# Patient Record
Sex: Male | Born: 1970 | Race: White | Hispanic: No | Marital: Married | Smoking: Current every day smoker
Health system: Southern US, Community
[De-identification: ages and names within clinical notes are randomized; demographics above are authoritative.]

## PROBLEM LIST (undated history)

## (undated) DIAGNOSIS — J189 Pneumonia, unspecified organism: Secondary | ICD-10-CM

## (undated) HISTORY — PX: BACK SURGERY: SHX140

---

## 2015-03-06 ENCOUNTER — Ambulatory Visit: Payer: BLUE CROSS/BLUE SHIELD

## 2015-03-06 ENCOUNTER — Encounter: Payer: Self-pay | Admitting: *Deleted

## 2015-03-06 ENCOUNTER — Ambulatory Visit
Admission: EM | Admit: 2015-03-06 | Discharge: 2015-03-06 | Disposition: A | Discharge status: Home / Self Care | Payer: BLUE CROSS/BLUE SHIELD | Attending: Family Medicine | Admitting: Family Medicine

## 2015-03-06 DIAGNOSIS — R509 Fever, unspecified: Secondary | ICD-10-CM | POA: Diagnosis not present

## 2015-03-06 DIAGNOSIS — J189 Pneumonia, unspecified organism: Secondary | ICD-10-CM | POA: Diagnosis not present

## 2015-03-06 DIAGNOSIS — D72829 Elevated white blood cell count, unspecified: Secondary | ICD-10-CM | POA: Diagnosis not present

## 2015-03-06 LAB — URINALYSIS COMPLETE WITH MICROSCOPIC (ARMC ONLY)
Bacteria, UA: NONE SEEN — AB
Squamous Epithelial / LPF: NONE SEEN — AB

## 2015-03-06 LAB — CBC WITH DIFFERENTIAL/PLATELET
Basophils Absolute: 0.1 10*3/uL (ref 0–0.1)
Basophils Relative: 1 %
EOS PCT: 0 %
Eosinophils Absolute: 0.1 10*3/uL (ref 0–0.7)
HEMATOCRIT: 45.1 % (ref 40.0–52.0)
Hemoglobin: 15.4 g/dL (ref 13.0–18.0)
LYMPHS ABS: 2.3 10*3/uL (ref 1.0–3.6)
Lymphocytes Relative: 13 %
MCH: 31.9 pg (ref 26.0–34.0)
MCHC: 34.3 g/dL (ref 32.0–36.0)
MCV: 93.2 fL (ref 80.0–100.0)
MONO ABS: 1.3 10*3/uL — AB (ref 0.2–1.0)
MONOS PCT: 8 %
Neutro Abs: 13.4 10*3/uL — ABNORMAL HIGH (ref 1.4–6.5)
Neutrophils Relative %: 78 %
Platelets: 259 10*3/uL (ref 150–440)
RBC: 4.83 MIL/uL (ref 4.40–5.90)
RDW: 13 % (ref 11.5–14.5)
WBC: 17.3 10*3/uL — ABNORMAL HIGH (ref 3.8–10.6)

## 2015-03-06 LAB — COMPREHENSIVE METABOLIC PANEL
ALK PHOS: 103 U/L (ref 38–126)
ALT: 11 U/L — ABNORMAL LOW (ref 17–63)
ANION GAP: 12 (ref 5–15)
AST: 17 U/L (ref 15–41)
Albumin: 3.7 g/dL (ref 3.5–5.0)
BUN: 21 mg/dL — ABNORMAL HIGH (ref 6–20)
CO2: 25 mmol/L (ref 22–32)
Calcium: 9.1 mg/dL (ref 8.9–10.3)
Chloride: 100 mmol/L — ABNORMAL LOW (ref 101–111)
Creatinine, Ser: 0.71 mg/dL (ref 0.61–1.24)
GFR calc non Af Amer: >60 mL/min (ref >60)
Glucose, Bld: 115 mg/dL — ABNORMAL HIGH (ref 65–99)
POTASSIUM: 3.7 mmol/L (ref 3.5–5.1)
SODIUM: 137 mmol/L (ref 135–145)
Total Bilirubin: 1.4 mg/dL — ABNORMAL HIGH (ref 0.3–1.2)
Total Protein: 8.3 g/dL — ABNORMAL HIGH (ref 6.5–8.1)

## 2015-03-06 LAB — RAPID INFLUENZA A&B ANTIGENS (ARMC ONLY)
INFLUENZA A (ARMC): NOT DETECTED
INFLUENZA B (ARMC): NOT DETECTED

## 2015-03-06 MED ORDER — LEVOFLOXACIN 750 MG PO TABS: 750.0000 mg | ORAL_TABLET | Freq: Every day | ORAL | Status: AC

## 2015-03-06 NOTE — ED Provider Notes (Signed)
CSN: [id number]     Arrival date & time 03/06/15  1320 History   First MD Initiated Contact with Patient 03/06/15 1511     Chief Complaint  Patient presents with  . 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 Predates finding a single non-engorged "lone star tick " by his report in right axilla 4 days ago- tender attachment site but no abcess, no rash. Reports Joints aching particularly knees and figers. Smoking about 1ppd x 30 plus years ( since age 57).Usual morning cough,recently had night cough as well. OTC not helping.Does not have PCP - no routine visits- no physicals for many years. Read about tick bites and presents for evaluation thinking he has "tick fever". Long bone aching -taking 3-4 BCs daily or more--now experiencing tinnitus. Current active smoker of 1 ppd plus...until about a week ago when he lost his appetite for cigarettes As he developed malaise.  History reviewed. No pertinent past medical history. Past Surgical History  Procedure Laterality Date  . Back surgery     Family History  Problem Relation Age of Onset  . Heart failure Mother   . Heart failure Father   . Cancer Father    History  Substance Use Topics  . Smoking status: Former Smoker -- 1.00 packs/day    Types: Cigarettes    Quit date: 03/06/2015  . Smokeless tobacco: Never Used  . Alcohol Use: Yes    Review of Systems  Constitutional -intermittent fevers to 101 reported, malaise, anorexia Eyes-denies visual changes ENT- normal voice,denies sore throat CV-denies chest pain, has cough,non-productive Resp-denies SOB GI- negative for nausea,vomiting, diarrhea, no belly pain, +anorexia  1 week GU- negative for dysuria MSK- had had some back pain- long days -farmer heavy  work- Right sided chest pain from coughing  joints have been aching knees and hands- denies swelling Skin- denies acute changes- no rashes Neuro- negative headache,focal weakness or numbness   Allergies  Penicillins  Home Medications   Prior to Admission medications   Medication Sig Start Date End Date Taking? Authorizing Provider  levofloxacin (LEVAQUIN) 750 MG tablet Take 1 tablet (750 mg total) by mouth daily. 03/06/15 03/10/15  Rae Halsted, PA-C   BP 146/89 mmHg  Pulse 89  Temp(Src) 101 F (38.3 C) (Oral)  Resp 20  Ht 5\' 10"  (1.778 m)  Wt 180 lb (81.647 kg)  BMI 25.83 kg/m2  SpO2 100% Physical Exam  Slender M appearing much older than stated age  Temp 101 darkly tanned, weathered skin Constitutional -alert and oriented, in mild distress about feeling bad lately Head-atraumatic Eyes- conjunctiva normal, EOMI ,conjugate gaze Ears-neg exam,tinnitus reported Nose- no congestion or rhinorrhea Mouth/throat- mucous membranes moist ,oropharynx mild erythematous Neck- supple without glandular enlargement CV- regular rate, grossly normal heart sounds, good peripheral circulation Resp-no distress, normal respiratory effort,clear to auscultation bilaterally GI- soft,non-tender,no distention,normal BS GU- not examined Back -well healed midline scar thoraco/lumbar from spinal fusion hx MSK- no lower extremity tenderness nor edema,no joint effusion, ambulatory Neuro- normal speech and language, no gross focal neurological deficit appreciated, no gait instability Skin-warm,dry ,intact; no rash noted Psych-mood and affect grossly normal; speech and behavior grossly normal  ED Course  Procedures (including critical care time) Labs Review Labs Reviewed  CBC WITH DIFFERENTIAL/PLATELET - Abnormal; Notable for the following:    WBC 17.3 (*)  Neutro Abs 13.4 (*)    Monocytes Absolute 1.3 (*)    All other components within normal limits  URINALYSIS COMPLETEWITH MICROSCOPIC ([facility]  ONLY) - Abnormal; Notable for the following:    Color, Urine ORANGE (*)    APPearance HAZY (*)    Glucose, UA   (*)    Value: TEST NOT REPORTED DUE TO COLOR INTERFERENCE OF URINE PIGMENT   Bilirubin Urine   (*)    Value: TEST NOT REPORTED DUE TO COLOR INTERFERENCE OF URINE PIGMENT   Ketones, ur   (*)    Value: TEST NOT REPORTED DUE TO COLOR INTERFERENCE OF URINE PIGMENT   Hgb urine dipstick   (*)    Value: TEST NOT REPORTED DUE TO COLOR INTERFERENCE OF URINE PIGMENT   Protein, ur   (*)    Value: TEST NOT REPORTED DUE TO COLOR INTERFERENCE OF URINE PIGMENT   Nitrite   (*)    Value: TEST NOT REPORTED DUE TO COLOR INTERFERENCE OF URINE PIGMENT   Leukocytes, UA   (*)    Value: TEST NOT REPORTED DUE TO COLOR INTERFERENCE OF URINE PIGMENT   Bacteria, UA NONE SEEN (*)    Squamous Epithelial / LPF NONE SEEN (*)    All other components within normal limits  COMPREHENSIVE METABOLIC PANEL - Abnormal; Notable for the following:    Chloride 100 (*)    Glucose, Bld 115 (*)    BUN 21 (*)    Total Protein 8.3 (*)    ALT 11 (*)    Total Bilirubin 1.4 (*)    All other components within normal limits  INFLUENZA A&B ANTIGENS ([facility] ONLY)  ROCKY MTN SPOTTED FVR ABS PNL(IGG+IGM)    Imaging Review No results found.  . Results for orders placed or performed during the hospital encounter of 03/06/15  Influenza A&B Antigens [facility])  Result Value Ref Range   Influenza A [facility]) NOT DETECTED    Influenza B ([facility]) NOT DETECTED   CBC with Differential  Result Value Ref Range   WBC 17.3 (H) 3.8 - 10.6 K/uL   RBC 4.83 4.40 - 5.90 MIL/uL   Hemoglobin 15.4 13.0 - 18.0 g/dL   HCT 16.145.1 09.640.0 - 04.552.0 %   MCV 93.2 80.0 - 100.0 fL   MCH 31.9 26.0 - 34.0 pg   MCHC 34.3 32.0 - 36.0 g/dL   RDW 40.913.0 81.111.5 - 91.414.5 %   Platelets 259 150 - 440 K/uL   Neutrophils Relative % 78 %   Neutro Abs 13.4 (H) 1.4 - 6.5 K/uL   Lymphocytes Relative 13 %   Lymphs Abs 2.3 1.0 - 3.6 K/uL   Monocytes Relative 8 %   Monocytes  Absolute 1.3 (H) 0.2 - 1.0 K/uL   Eosinophils Relative 0 %   Eosinophils Absolute 0.1 0 - 0.7 K/uL   Basophils Relative 1 %   Basophils Absolute 0.1 0 - 0.1 K/uL  Urinalysis complete, with microscopic  Result Value Ref Range   Color, Urine ORANGE (A) YELLOW   APPearance HAZY (A) CLEAR   Glucose, UA (A) NEGATIVE mg/dL    TEST NOT REPORTED DUE TO COLOR INTERFERENCE OF URINE PIGMENT   Bilirubin Urine (A) NEGATIVE    TEST NOT REPORTED DUE TO COLOR INTERFERENCE OF URINE PIGMENT   Ketones, ur (A) NEGATIVE mg/dL    TEST NOT REPORTED DUE TO COLOR INTERFERENCE OF URINE PIGMENT   Specific Gravity, Urine  1.005 - 1.030    TEST NOT REPORTED DUE TO COLOR  INTERFERENCE OF URINE PIGMENT   Hgb urine dipstick (A) NEGATIVE    TEST NOT REPORTED DUE TO COLOR INTERFERENCE OF URINE PIGMENT   pH  5.0 - 8.0    TEST NOT REPORTED DUE TO COLOR INTERFERENCE OF URINE PIGMENT   Protein, ur (A) NEGATIVE mg/dL    TEST NOT REPORTED DUE TO COLOR INTERFERENCE OF URINE PIGMENT   Nitrite (A) NEGATIVE    TEST NOT REPORTED DUE TO COLOR INTERFERENCE OF URINE PIGMENT   Leukocytes, UA (A) NEGATIVE    TEST NOT REPORTED DUE TO COLOR INTERFERENCE OF URINE PIGMENT   RBC / HPF 6-30 <3 RBC/hpf   WBC, UA 6-30 <3 WBC/hpf   Bacteria, UA NONE SEEN (A) RARE   Squamous Epithelial / LPF NONE SEEN (A) RARE   Mucous PRESENT    Hyaline Casts, UA PRESENT   Comprehensive metabolic panel  Result Value Ref Range   Sodium 137 135 - 145 mmol/L   Potassium 3.7 3.5 - 5.1 mmol/L   Chloride 100 (L) 101 - 111 mmol/L   CO2 25 22 - 32 mmol/L   Glucose, Bld 115 (H) 65 - 99 mg/dL   BUN 21 (H) 6 - 20 mg/dL   Creatinine, Ser 1.61 0.61 - 1.24 mg/dL   Calcium 9.1 8.9 - 09.6 mg/dL   Total Protein 8.3 (H) 6.5 - 8.1 g/dL   Albumin 3.7 3.5 - 5.0 g/dL   AST 17 15 - 41 U/L   ALT 11 (L) 17 - 63 U/L   Alkaline Phosphatase 103 38 - 126 U/L   Total Bilirubin 1.4 (H) 0.3 - 1.2 mg/dL   GFR calc non Af Amer >60 >60 mL/min   GFR calc Af Amer >60 >60 mL/min    Anion gap 12 5 - 15    Reports decreasing appetite since this episode started a week ago,.Had noodle soup last night and a pack of nabs this morning but denies other intake. (glucose 115) Not sure but thinks his mother may have had DM. Strongly encouraged to work to hydrate himself- urine culture pending. He noted onset of darker urine yesterday. Need alternating tylenol and ibuprofen for fever control ( and hydration)- avoid aspirin /BCs. The area in the lung is being treated as pneumonia but needs to be followed closely after therapy for resolution as rounded appearance not classic for wedge of pneumonia.His additional labs are pending.  MDM   1. Community acquired pneumonia    Discharge Medication List as of 03/06/2015  5:36 PM    START taking these medications   Details  levofloxacin (LEVAQUIN) 750 MG tablet Take 1 tablet (750 mg total) by mouth daily., Starting 03/06/2015, Until Thu 03/10/15, Normal      Benzonatate 200 mg # 30   1 TID prn cough  called to pharmacy drive thru   He and his wife are going to [facility] on Tuesday morning for her appointment  And will to try to establish care for him. If he can be seen in near future will establish, records will be forwarded to them. If he is unable to be seen he is to report back here for re-evaluation in the next week. In ability to take fluids or to eat ar to be taken very seriously and he is to return.  Fever that cannot be controlled with tylenol/ibuprofen /tepid baths and hydration need to be responded to as well.   Plan: 1. Test/x-ray results and diagnosis reviewed with patient. Additional results pending. 2. rx  as per orders; risks, benefits, potential side effects reviewed with patient 3. Recommend supportive treatment with fever control,hydration, rest 4. F/u prn if symptoms worsen or don't improve Diagnosis and treatment discussed. . Questions fielded, expectations and recommendations reviewed. Patient /wife express  understanding. Will return to [facility] or ER of choice with questions, concern or exacerbation.     Rae Halsted, PA-C 03/08/15 1714

## 2015-03-06 NOTE — ED Notes (Addendum)
Tick bite found Tuesday, removed it himself.  Achy and fever started Sunday before he actually found the tick.  Complains of malaise, body aches, chills and headache.  He states tick was a "lone star tick"  Removed tick from right axilla.  Area slightly red, no red ring or drainage noted.

## 2015-03-10 ENCOUNTER — Encounter: Payer: Self-pay | Admitting: Emergency Medicine

## 2015-03-10 ENCOUNTER — Ambulatory Visit
Admission: EM | Admit: 2015-03-10 | Discharge: 2015-03-10 | Disposition: A | Discharge status: Home / Self Care | Payer: BLUE CROSS/BLUE SHIELD | Attending: Internal Medicine | Admitting: Internal Medicine

## 2015-03-10 DIAGNOSIS — F17210 Nicotine dependence, cigarettes, uncomplicated: Secondary | ICD-10-CM | POA: Insufficient documentation

## 2015-03-10 DIAGNOSIS — Z79899 Other long term (current) drug therapy: Secondary | ICD-10-CM | POA: Diagnosis not present

## 2015-03-10 DIAGNOSIS — J189 Pneumonia, unspecified organism: Secondary | ICD-10-CM

## 2015-03-10 DIAGNOSIS — R05 Cough: Secondary | ICD-10-CM | POA: Diagnosis present

## 2015-03-10 DIAGNOSIS — R509 Fever, unspecified: Secondary | ICD-10-CM | POA: Diagnosis present

## 2015-03-10 HISTORY — DX: Pneumonia, unspecified organism: J18.9

## 2015-03-10 LAB — CBC WITH DIFFERENTIAL/PLATELET
Basophils Absolute: 0.1 10*3/uL (ref 0–0.1)
Basophils Relative: 1 %
Eosinophils Absolute: 0.1 10*3/uL (ref 0–0.7)
Eosinophils Relative: 0 %
HCT: 42 % (ref 40.0–52.0)
Hemoglobin: 14.5 g/dL (ref 13.0–18.0)
Lymphocytes Relative: 10 %
Lymphs Abs: 1.7 10*3/uL (ref 1.0–3.6)
MCH: 31.8 pg (ref 26.0–34.0)
MCHC: 34.6 g/dL (ref 32.0–36.0)
MCV: 92 fL (ref 80.0–100.0)
Monocytes Absolute: 1.5 10*3/uL — ABNORMAL HIGH (ref 0.2–1.0)
Monocytes Relative: 9 %
Neutro Abs: 13.8 10*3/uL — ABNORMAL HIGH (ref 1.4–6.5)
Neutrophils Relative %: 80 %
Platelets: 337 10*3/uL (ref 150–440)
RBC: 4.56 MIL/uL (ref 4.40–5.90)
RDW: 12.8 % (ref 11.5–14.5)
WBC: 17.1 10*3/uL — ABNORMAL HIGH (ref 3.8–10.6)

## 2015-03-10 LAB — ROCKY MTN SPOTTED FVR ABS PNL(IGG+IGM)
RMSF IGM: 0.35 {index} (ref 0.00–0.89)
RMSF IgG: POSITIVE — AB

## 2015-03-10 LAB — RMSF, IGG, IFA: RMSF, IGG, IFA: 1:256 {titer} — ABNORMAL HIGH

## 2015-03-10 MED ORDER — LEVOFLOXACIN 500 MG PO TABS: 500.0000 mg | ORAL_TABLET | Freq: Every day | ORAL | Status: AC

## 2015-03-10 NOTE — Discharge Instructions (Signed)

## 2015-03-10 NOTE — ED Notes (Signed)
Pt seen here Sunday, diagnosed with pneumonia, taking Levaquin. Pt reports still having fever and chills, coughing a lot, doesn't feel much better.

## 2015-03-10 NOTE — ED Provider Notes (Signed)
CSN: [id number]     Arrival date & time 03/10/15  1442 History   First MD Initiated Contact with Patient 03/10/15 1635     Chief Complaint  Patient presents with  . Fever  . Cough   (Consider location/radiation/quality/duration/timing/severity/associated sxs/prior Treatment) HPI   This 44 year old gentleman returns today after his diagnosed with pneumonia on Sunday. He admits that he is having some improvement his temperature is down to 99.6 his braces are 18 and SPO2 is 99%. His wife admits that he is looking better. He denies having a productive cough. He has 1 more day left of his Levaquin is been taking it for 4 days. States he has not been working in the [employer] and has enlisted other people to help him. He is not having any chills but did have those before.  Past Medical History  Diagnosis Date  . Pneumonia    Past Surgical History  Procedure Laterality Date  . Back surgery     Family History  Problem Relation Age of Onset  . Heart failure Mother   . Heart failure Father   . Cancer Father    History  Substance Use Topics  . Smoking status: Current Every Day Smoker -- 1.00 packs/day    Types: Cigarettes    Last Attempt to Quit: 03/06/2015  . Smokeless tobacco: Never Used  . Alcohol Use: Yes    Review of Systems  Constitutional: Positive for activity change.  Respiratory: Negative for cough, choking, chest tightness and shortness of breath.     Allergies  Pollen extract and Penicillins  Home Medications   Prior to Admission medications   Medication Sig Start Date End Date Taking? Authorizing Provider  levofloxacin (LEVAQUIN) 500 MG tablet Take 1 tablet (500 mg total) by mouth daily. 03/10/15   Lutricia FeilWilliam P Erica Richwine, PA-C  levofloxacin (LEVAQUIN) 750 MG tablet Take 1 tablet (750 mg total) by mouth daily. 03/06/15 03/10/15  Rae HalstedLaurie W Lee, PA-C   BP 138/77 mmHg  Pulse 87  Temp(Src) 99.6 F (37.6 C) (Oral)  Resp 18  Ht 5\' 10"  (1.778 m)  Wt 160 lb (72.576 kg)  BMI  22.96 kg/m2  SpO2 99% Physical Exam  Constitutional: He is oriented to person, place, and time. He appears well-developed and well-nourished.  HENT:  Head: Normocephalic and atraumatic.  Eyes: EOM are normal. Pupils are equal, round, and reactive to light.  Neck: Normal range of motion. Neck supple.  Cardiovascular: Normal rate, regular rhythm, normal heart sounds and intact distal pulses.  Exam reveals no gallop and no friction rub.   No murmur heard. Pulmonary/Chest: Effort normal. No respiratory distress. He has wheezes. He has no rales. He exhibits no tenderness.  Musculoskeletal: Normal range of motion. He exhibits no edema.  Lymphadenopathy:    He has no cervical adenopathy.  Neurological: He is alert and oriented to person, place, and time. He has normal reflexes.  Skin: Skin is warm and dry.  Psychiatric: He has a normal mood and affect. His behavior is normal. Judgment and thought content normal.    ED Course  Procedures (including critical care time) Labs Review Labs Reviewed  CBC WITH DIFFERENTIAL/PLATELET - Abnormal; Notable for the following:    WBC 17.1 (*)    Neutro Abs 13.8 (*)    Monocytes Absolute 1.5 (*)    All other components within normal limits    Imaging Review No results found.   MDM   1. CAP (community acquired pneumonia)    I spoke  with the patient and his wife and note that he is absolutely looking better than was explained to me earlier. He says he feels a tad better as well. CBC is essentially unchanged but he has less fever today. His O2 sat is normal. We'll continue with his Levaquin for a full 10 days. I also recommended we send him over to a pulmonologist for further evaluation of his x-ray. 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 We will see him back on Monday for a follow-up to make sure that he is improving and hopefully they will make an appointment with the pulmonologist for the following  week    Lutricia Feil, PA-C 03/10/15 2131

## 2015-03-14 ENCOUNTER — Ambulatory Visit
Admission: EM | Admit: 2015-03-14 | Discharge: 2015-03-14 | Disposition: A | Discharge status: Home / Self Care | Payer: BLUE CROSS/BLUE SHIELD | Attending: Family Medicine | Admitting: Family Medicine

## 2015-03-14 ENCOUNTER — Encounter: Payer: Self-pay | Admitting: Emergency Medicine

## 2015-03-14 DIAGNOSIS — J189 Pneumonia, unspecified organism: Secondary | ICD-10-CM

## 2015-03-14 NOTE — ED Provider Notes (Signed)
CSN: [id number]     Arrival date & time 03/14/15  1110 History   First MD Initiated Contact with Patient 03/14/15 1223     Chief Complaint  Patient presents with  . Pneumonia   (Consider location/radiation/quality/duration/timing/severity/associated sxs/prior Treatment) HPI Comments: 44 yo male here for follow up on pneumonia. Has been seen twice last week. States doing better and improving although not completely back to normal. Still with some coughing. Denies chest pains or shortness of breath.   Patient is a 44 y.o. male presenting with pneumonia.  Pneumonia    Past Medical History  Diagnosis Date  . Pneumonia    Past Surgical History  Procedure Laterality Date  . Back surgery     Family History  Problem Relation Age of Onset  . Heart failure Mother   . Heart failure Father   . Cancer Father    History  Substance Use Topics  . Smoking status: Current Every Day Smoker -- 1.00 packs/day    Types: Cigarettes    Last Attempt to Quit: 03/06/2015  . Smokeless tobacco: Never Used  . Alcohol Use: Yes    Review of Systems  Allergies  Pollen extract and Penicillins  Home Medications   Prior to Admission medications   Medication Sig Start Date End Date Taking? Authorizing Provider  levofloxacin (LEVAQUIN) 500 MG tablet Take 1 tablet (500 mg total) by mouth daily. 03/10/15  Yes William P Roemer, PA-C   BP 120/75 mmHg  Pulse 74  Temp(Src) 98.3 F (36.8 C) (Oral)  Resp 16  Ht 5\' 10"  (1.778 m)  Wt 160 lb (72.576 kg)  BMI 22.96 kg/m2  SpO2 99% Physical Exam  Constitutional: He appears well-developed and well-nourished. No distress.  Cardiovascular: Normal rate, regular rhythm, normal heart sounds and intact distal pulses.   Pulmonary/Chest: Effort normal and breath sounds normal. No respiratory distress. He has no wheezes. He has no rales. He exhibits no tenderness.  Skin: He is not diaphoretic.  Nursing note and vitals reviewed.   ED Course  Procedures (including  critical care time) Labs Review Labs Reviewed - No data to display  Imaging Review No results found.   MDM   1. Community acquired pneumonia    Plan: 1.  diagnosis reviewed with patient 2. Continue and finish current antibiotic 3. Recommend schedule follow up appt with pulmonologist 4. F/u prn if symptoms worsen or don't improve    Payton Mccallumrlando Soriyah Osberg, MD 03/17/15 2110

## 2015-03-14 NOTE — ED Notes (Signed)
Patient states he is here for a recheck of pneumonia. Patient states he is better but still having sweats and chills at night, unable to get any rest.

## 2015-10-24 ENCOUNTER — Encounter: Payer: Self-pay | Admitting: Emergency Medicine

## 2015-10-24 ENCOUNTER — Ambulatory Visit
Admission: EM | Admit: 2015-10-24 | Discharge: 2015-10-24 | Disposition: A | Discharge status: Home / Self Care | Payer: Self-pay | Attending: Family Medicine | Admitting: Family Medicine

## 2015-10-24 ENCOUNTER — Ambulatory Visit (INDEPENDENT_AMBULATORY_CARE_PROVIDER_SITE_OTHER): Payer: Self-pay

## 2015-10-24 DIAGNOSIS — M25462 Effusion, left knee: Secondary | ICD-10-CM

## 2015-10-24 MED ORDER — KETOROLAC TROMETHAMINE 60 MG/2ML IM SOLN
60.0000 mg | Freq: Once | INTRAMUSCULAR | Status: AC
  Administered 2015-10-24: 60 mg via INTRAMUSCULAR

## 2015-10-24 NOTE — ED Provider Notes (Signed)
CSN: [id number]     Arrival date & time 10/24/15  1208 History   First MD Initiated Contact with Patient 10/24/15 1438     Chief Complaint  Patient presents with  . Knee Pain   (Consider location/radiation/quality/duration/timing/severity/associated sxs/prior Treatment) HPI  45 yo M was rough-housing and wrestling with 326 yo son yesterday when left knee assumed awkward position and son accidently fell on it. Patient is unable to clarify position at time of trauma or direction of torque. There was immediate knee pain but Tinnie GensJeffrey could walk on it and packed it in ice. Overnight there was increased swelling and pain, no longer weight bearing. His family members had 2 Percocet and a few Tramadol tablets which he has used overnight and throughout the day Medial left knee now swollen and mildly warm. He has stopped weight bearing and is using borrowed crutches. He presents tonight for Xray evaluation and consultation. Has plans to see [facility] in [place] if we feel additional care needed. Tried an elastic knee support a friend had and he feels it made it worse. Patient is known to us from previous visist with persistent/recurrent pneumonia . Long time smoker/COPD. 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 Screening labs were reported non diagnostic, CT showed improvement and follow up CT is planned in near future per patient and wife ( breast cancer survivor). He is followed by [facility] on [place], [place]  and couple states that the pneumonia is improving. CT still planned . Continues to smoke a pack a day Past Medical History  Diagnosis Date  . Pneumonia    Past Surgical History  Procedure Laterality Date  . Back surgery     Family History  Problem Relation Age of Onset  . Heart failure Mother   . Heart failure Father   . Cancer Father    Social History  Substance Use Topics  . Smoking status: Current Every Day Smoker -- 1.00 packs/day   Types: Cigarettes    Last Attempt to Quit: 03/06/2015  . Smokeless tobacco: Never Used  . Alcohol Use: Yes    Review of Systems  Constitutional: No fever. No headache. Eyes: No visual changes. ENT:No sore throat. Cardiovascular:Negative for chest pain/palpitations Respiratory: Negative for increased shortness of breath- has COPD and resolving pneumonia Gastrointestinal: No abdominal pain. No nausea,vomiting, diarrhea Genitourinary: Negative for dysuria. Normal urination. Musculoskeletal: Negative for back pain. FROM extremities without pain except left knee- acute injury as reported  Skin: Negative for rash Neurological: Negative for headache, focal weakness or numbness  Allergies  Pollen extract and Penicillins  Home Medications   Prior to Admission medications   Medication Sig Start Date End Date Taking? Authorizing Provider  levofloxacin (LEVAQUIN) 500 MG tablet Take 1 tablet (500 mg total) by mouth daily. 03/10/15   Lutricia FeilWilliam P Roemer, PA-C   Meds Ordered and Administered this Visit   Medications  ketorolac (TORADOL) injection 60 mg (60 mg Intramuscular Given 10/24/15 1601)  Improved with Rx- not totally resolved but more comfortable  BP 148/88 mmHg  Pulse 79  Temp(Src) 98.7 F (37.1 C) (Tympanic)  Resp 16  Ht 5\' 10"  (1.778 m)  Wt 155 lb (70.308 kg)  BMI 22.24 kg/m2  SpO2 99% No data found.   Physical Exam. Wife is present throughout consult and exam  VS noted, WNL GENERAL : moderate distress left  knee pain, otherwise reports doing well HEENT: no pharyngeal erythema,no exudate, no erythema of TMs, no cervical LAD  RESP: CTA  B , no wheezing, no accessory muscle use- COPD, chronic Resp 16/99% CARD: RRR ABD: Not distended NEURO: Good attention, good recall, no gross neuro defecit PSYCH: speech and behavior appropriate ZOX:WRUE knee is swollen moderately , predominantly medially. Medially there is erythematous flush and warmth to touch. Knee joint itself is stable  to exam, neg drawer, increased discomfort with gentle torsion 4 . Pedal pulses present and equal . Neurosensory changes denied Moderate effusion suspected; Toes with good cap fill and no change in feeling reported Skin: Negative for rash; erythema noted as above Neurological: Negative for headache, focal weakness or numbness  ED Course  Procedures (including critical care time)  Labs Review Labs Reviewed - No data to display  Imaging Review Dg Knee Complete 4 Views Left  10/24/2015  CLINICAL DATA:  Knee injury playing football, another athlete fell on the patient' s knee from a medial approach. Not able to bear weight or straighten the knee. EXAM: LEFT KNEE - COMPLETE 4+ VIEW COMPARISON:  None. FINDINGS: Large knee effusion. No fracture identified. Mild atherosclerotic calcification. Minimal marginal spurring along the patella. IMPRESSION: 1. Large knee effusion. No discrete fracture. Consider MRI to assess for internal derangement. 2. Mild atherosclerotic calcification. Electronically Signed   By: Gaylyn Rong M.D.   On: 10/24/2015 15:00   Discussed films with Dr Thurmond Butts, patient and wife. Non-weight bearing. Crutches. Ice pack, anti-inflammatory recommended. Joint aspiration not available at this facility. Recommend they continue with previous plans to have cousin contact Ortho she knows for evaluation- or present through the [facility] if clinic appointment not available. Radiology recommends an MRI for further evaluation- to be accomplished if no improvement and per their evaluation  He accepted the Toradol after realizing his last pain Rx had been tylenol many hours before arrival,had significant improvement in discomfort He defers Rx as ibuprofen and Tramadol available at home.    MDM   1. Knee effusion, left     Plan: Test/x-ray results and diagnosis reviewed with patient/spouse  Rx as per orders;  benefits, risks, potential side effects reviewed  Non-weight  bearing , crutches. 24/7  NO  immobilizer is placed- use discouraged-pending additional eval  Recommend supportive treatment with cyclic tylenol and ibuprofen Anticipate Ortho consult in near future- reviewed leg and foot checks with patient and wife   Questions fielded, expectations and recommendations reviewed. Discussed follow up and return parameters including no resolution or any worsening condition .Marland Kitchen Patient expresses understanding  and agrees to plan. Will return to [facility] with questions, concerns or exacerbation.     Rae Halsted, PA-C 10/27/15 2206

## 2015-10-24 NOTE — ED Notes (Signed)
Patient states that he was playing with his grandkids and fell Friday night. Patient states that he twisted his left knee.  Patient c/o left knee pain.

## 2015-10-24 NOTE — Discharge Instructions (Signed)
°  Ibuprofen 600 mg as needed 3 x day with meal x 2-3 days  Ice and elevation  Non weight bearing  Crutches  Knee Effusion  Orthopedic consultation- take disc  Knee effusion means that you have extra fluid in your knee. This can cause pain. Your knee may be more difficult to bend and move. HOME CARE  Use crutches as told by your doctor.  Wear a knee brace as told by your doctor.  Apply ice to the swollen area:  Put ice in a plastic bag.  Place a towel between your skin and the bag.  Leave the ice on for 20 minutes, 2-3 times per day.  Keep your knee raised (elevated) when you are sitting or lying down.  Take medicines only as told by your doctor.  Do any rehabilitation or strengthening exercises as told by your doctor.  Rest your knee as told by your doctor. You may start doing your normal activities again when your doctor says it is okay.  Keep all follow-up visits as told by your doctor. This is important. GET HELP IF:   You continue to have pain in your knee. GET HELP RIGHT AWAY IF:  You have increased swelling or redness of your knee.  You have severe pain in your knee.  You have a fever.   This information is not intended to replace advice given to you by your health care provider. Make sure you discuss any questions you have with your health care provider.   Document Released: 10/27/2010 Document Revised: 10/15/2014 Document Reviewed: 05/10/2014 [employer] Interactive Patient Education [employer]

## 2017-08-26 IMAGING — CR DG KNEE COMPLETE 4+V*L*
4 series · 5 of 5 positions shown · non-contrast
Comparison: None.

CLINICAL DATA: Knee injury playing football, another athlete fell
on the patient' s knee from a medial approach. Not able to bear
weight or straighten the knee.

EXAM:
LEFT KNEE - COMPLETE 4+ VIEW

[Series 1: knee ap · 0.14mm/px · 2 of 2 slices shown]
[im 1/2]
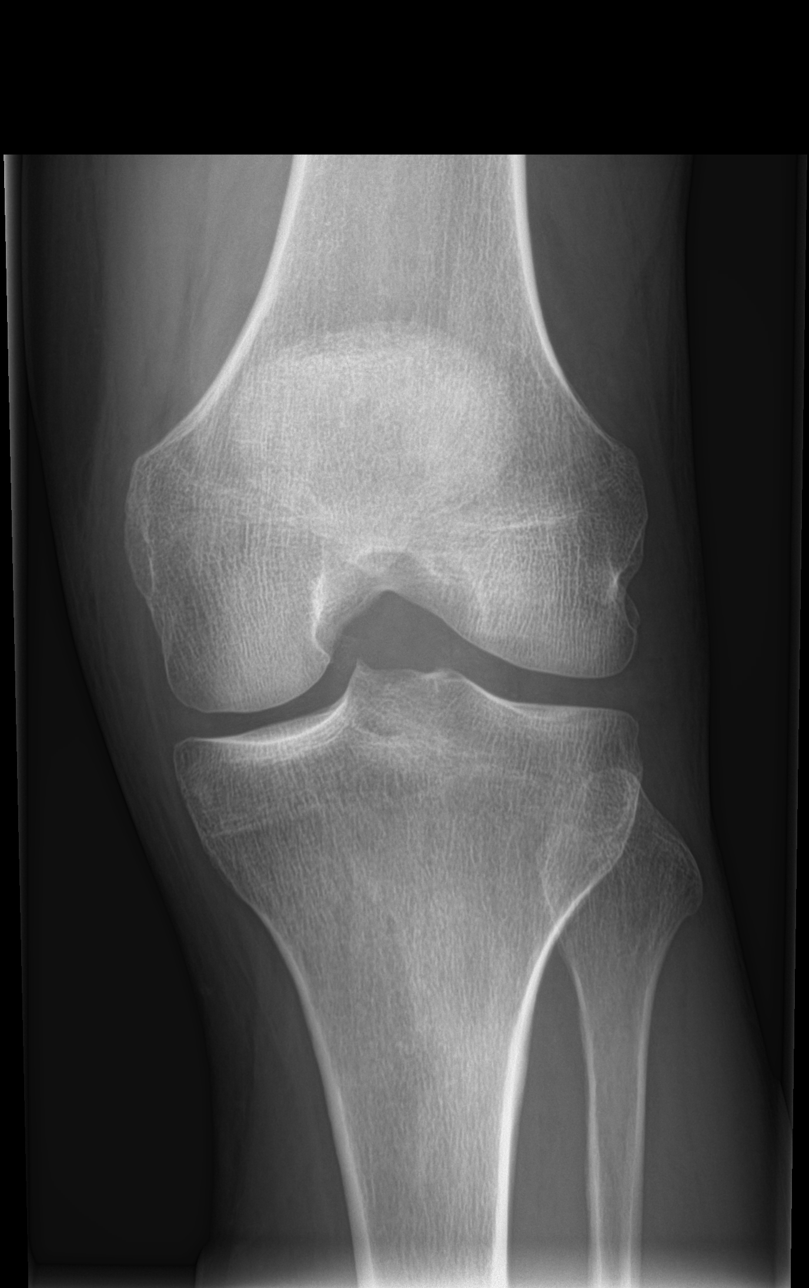
[im 2/2]
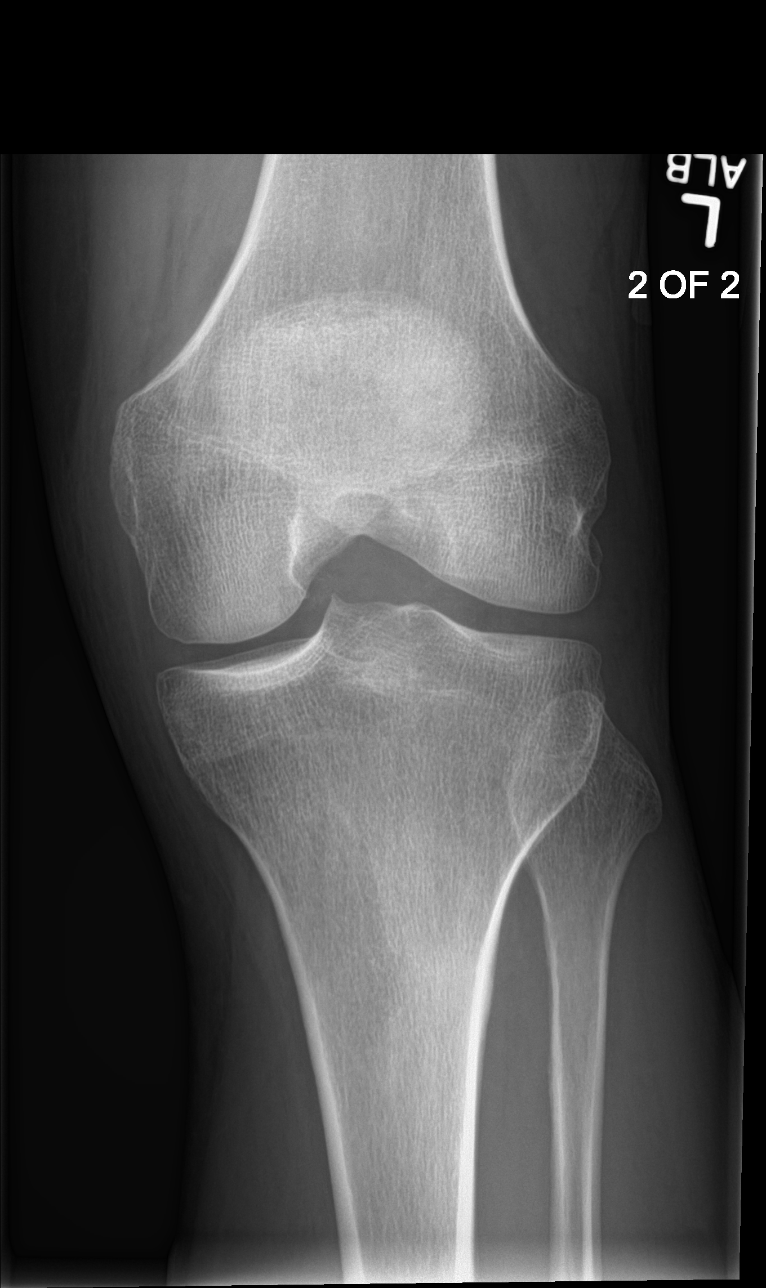

[tunnel]
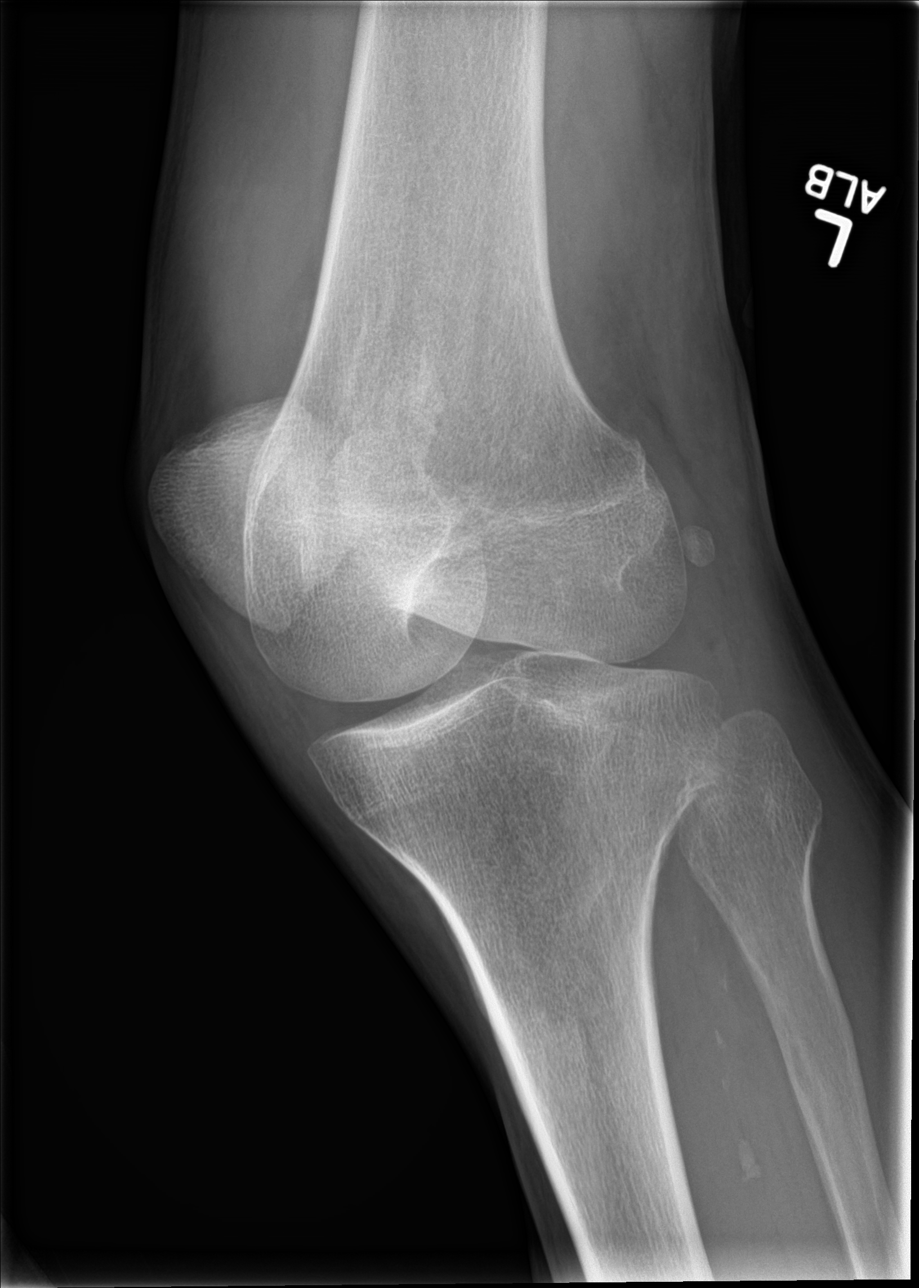

[knee lat]
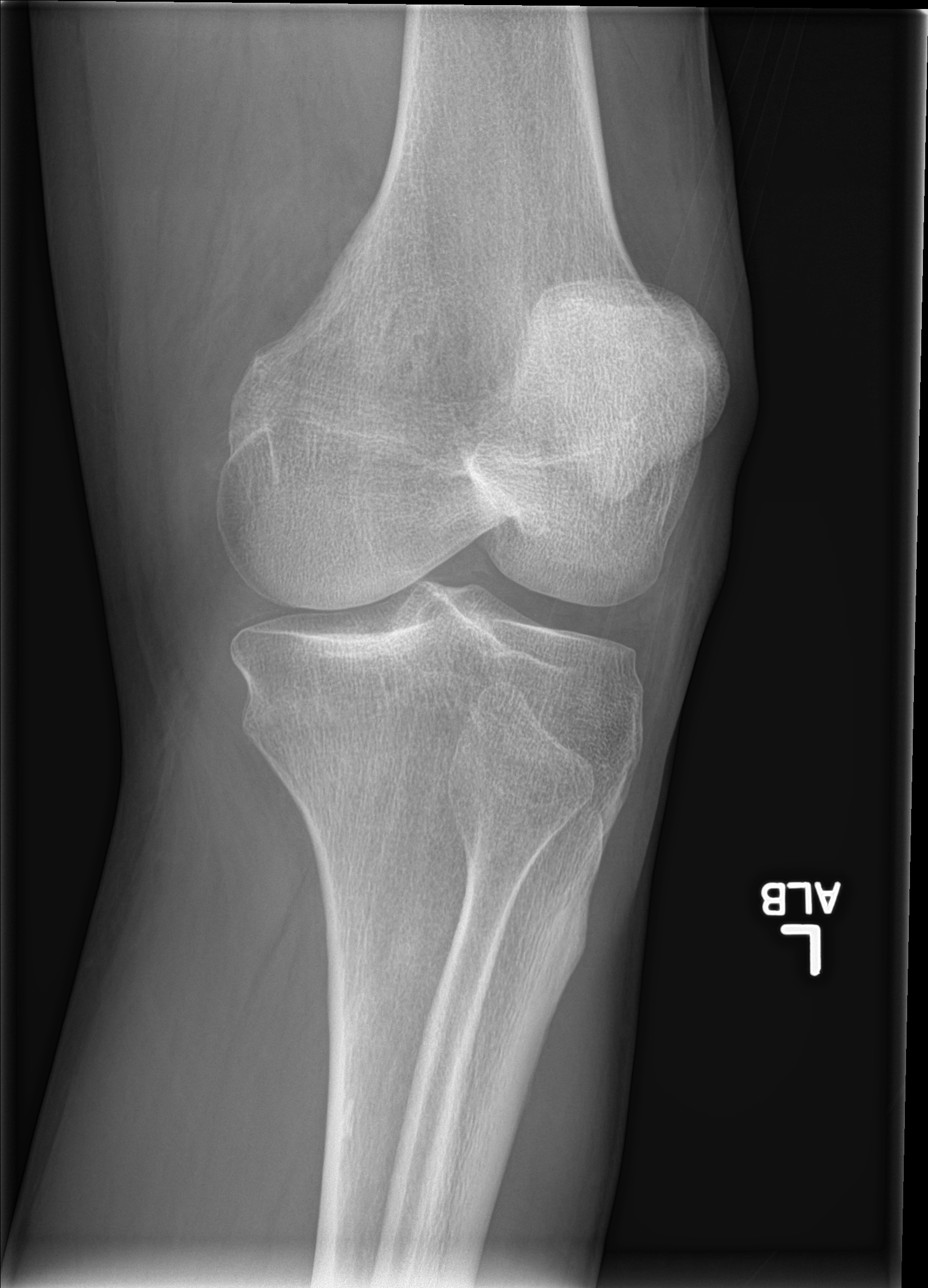

[patella skyline]
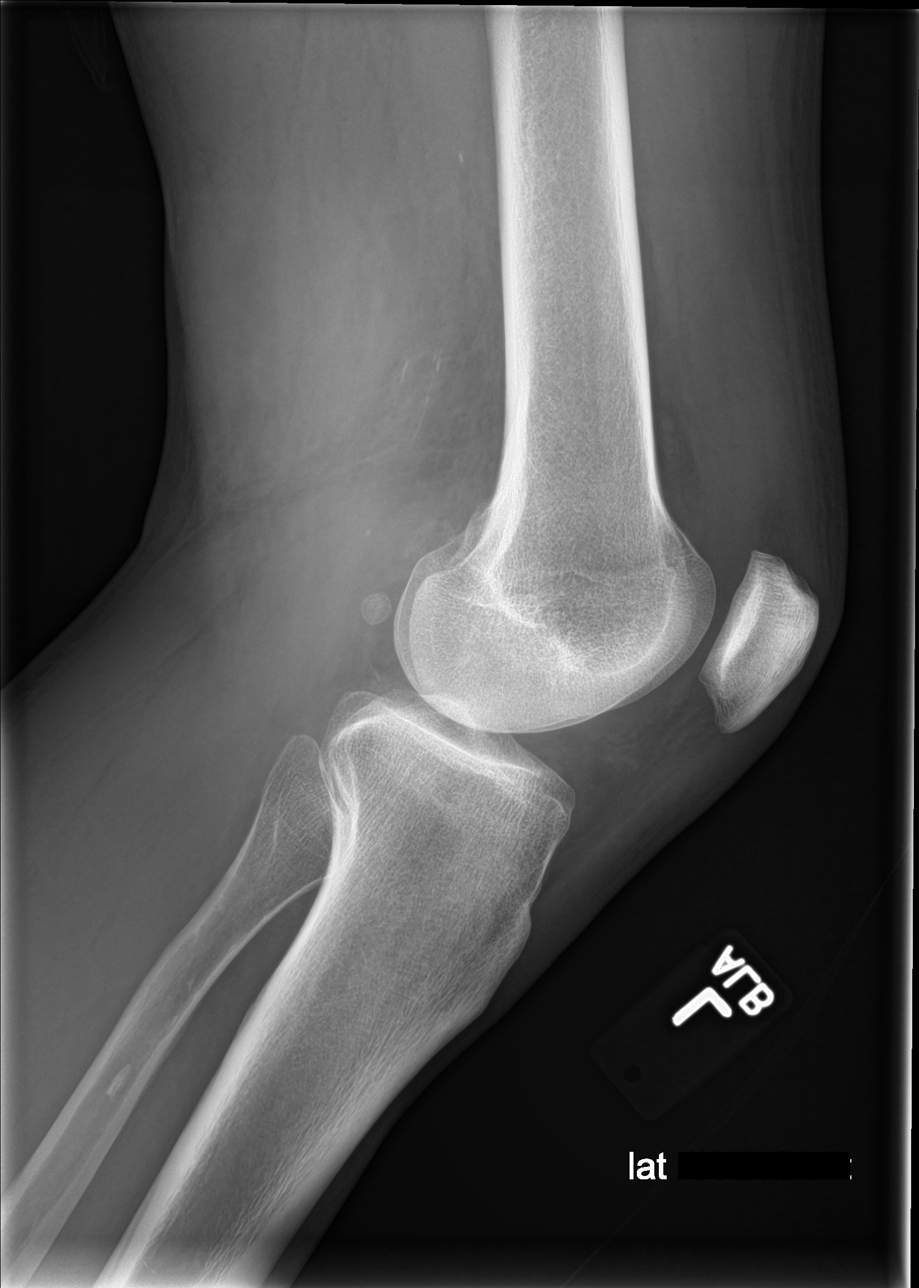

[5 of 5 positions shown; findings below may reference images not displayed]

FINDINGS: Large knee effusion. No fracture identified. Mild atherosclerotic
calcification. Minimal marginal spurring along the patella.
IMPRESSION: 1. Large knee effusion. No discrete fracture. Consider MRI to assess
for internal derangement.
2. Mild atherosclerotic calcification.
